# Patient Record
Sex: Male | Born: 1985 | Race: White | Hispanic: No
Health system: Western US, Academic
[De-identification: ages and names within clinical notes are randomized; demographics above are authoritative.]

## PROBLEM LIST (undated history)

## (undated) DIAGNOSIS — Z8669 Personal history of other diseases of the nervous system and sense organs: Secondary | ICD-10-CM

## (undated) DIAGNOSIS — R040 Epistaxis: Secondary | ICD-10-CM

## (undated) HISTORY — DX: Personal history of other diseases of the nervous system and sense organs: Z86.69

## (undated) HISTORY — DX: Epistaxis: R04.0

## (undated) HISTORY — PX: SURGICAL HX OTHER: 99

---

## 2012-05-03 IMAGING — US US SCROTUM
1 series · 14 of 25 positions shown · non-contrast
Comparison: None.

CLINICAL DATA: Left scrotal lesion.

ULTRASOUND OF SCROTUM
TECHNIQUE: Complete ultrasound examination of the testicles,
epididymis, and other scrotal structures was performed.

[Series 1: us scrotum · 0.07mm/px · 14 of 35 slices shown]
[im 1/35]
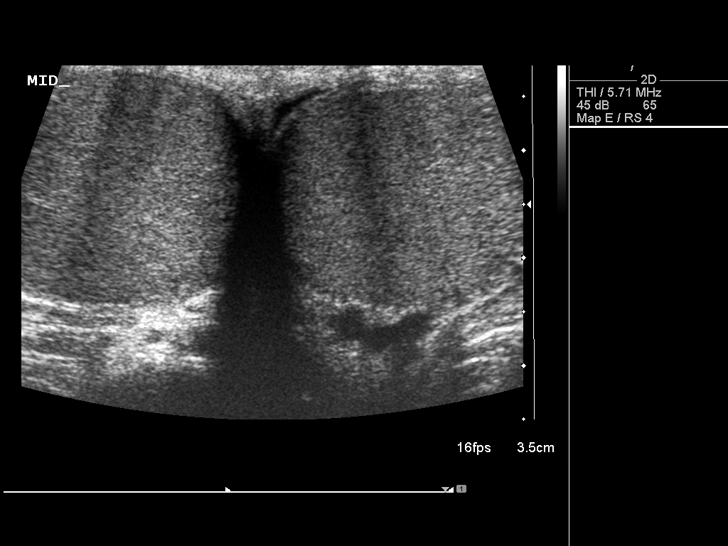
[im 3/35]
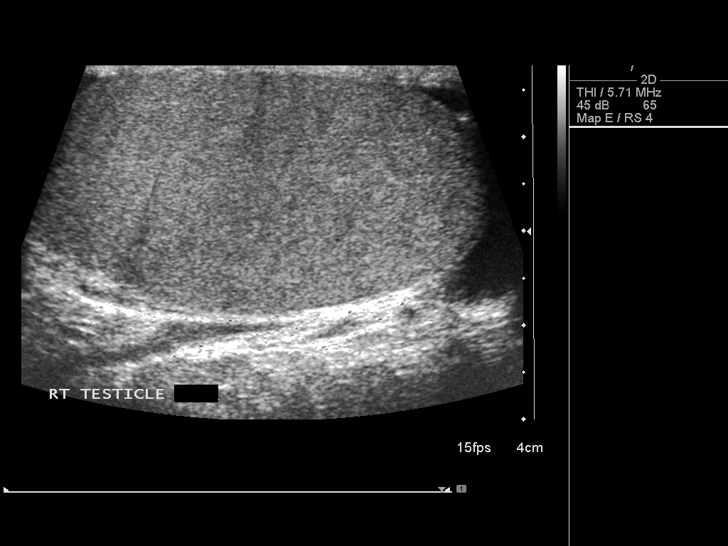
[im 6/35]
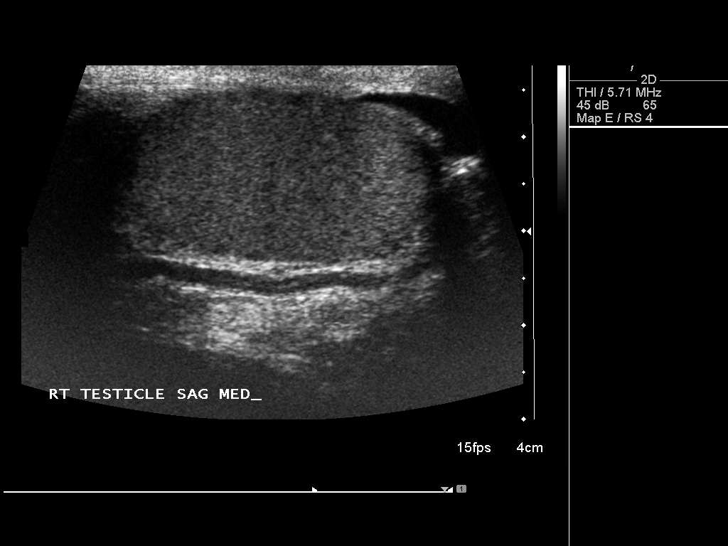
[im 9/35]
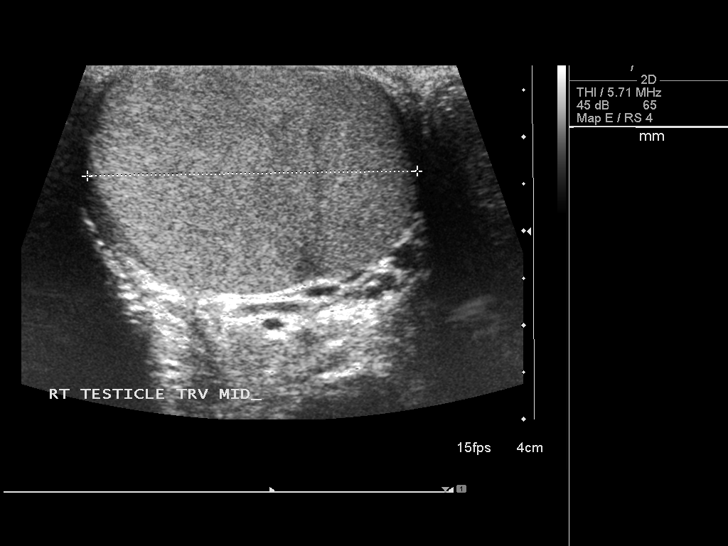
[im 12/35]
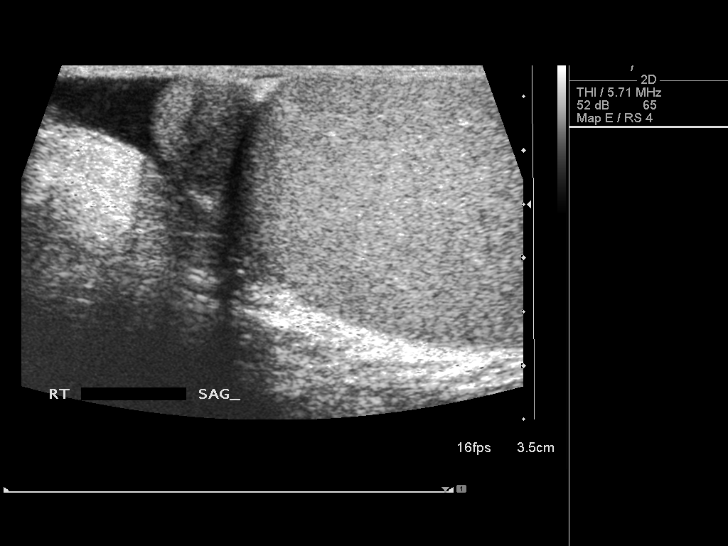
[im 13/35]
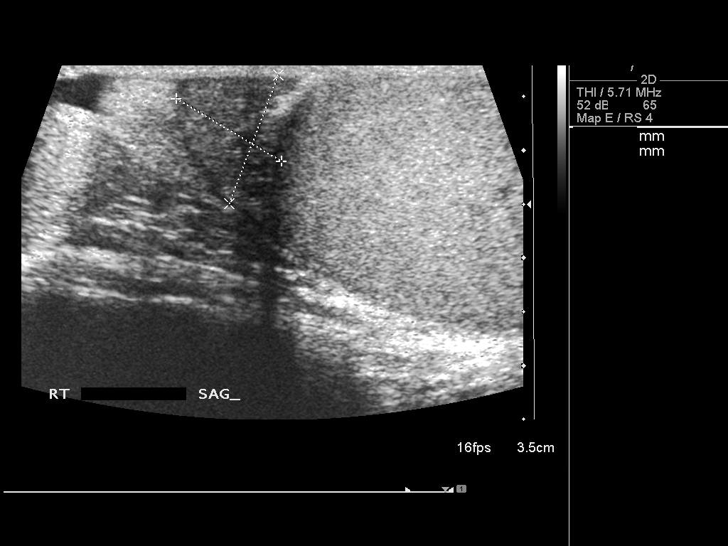
[im 16/35]
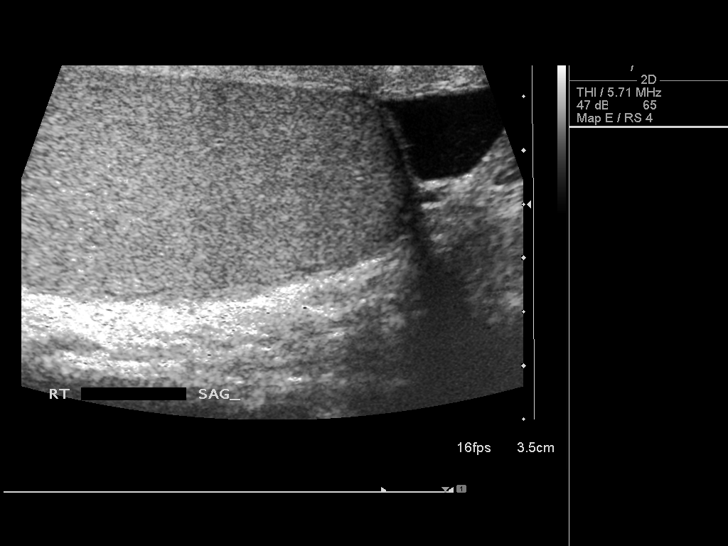
[im 19/35]
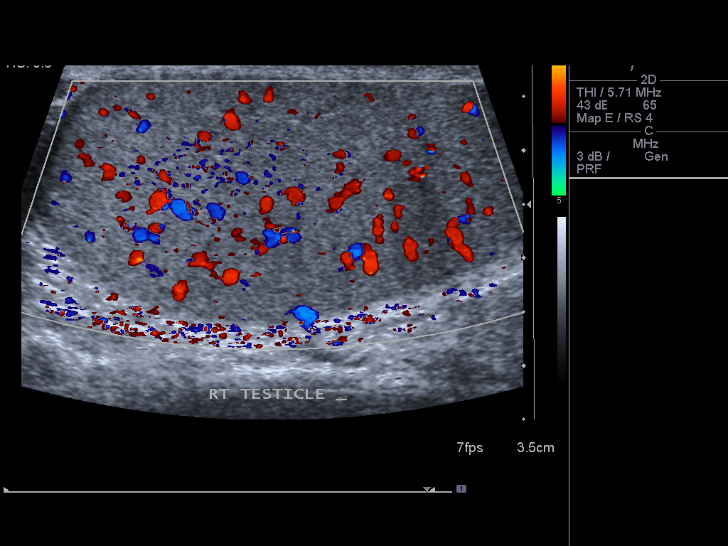
[im 22/35]
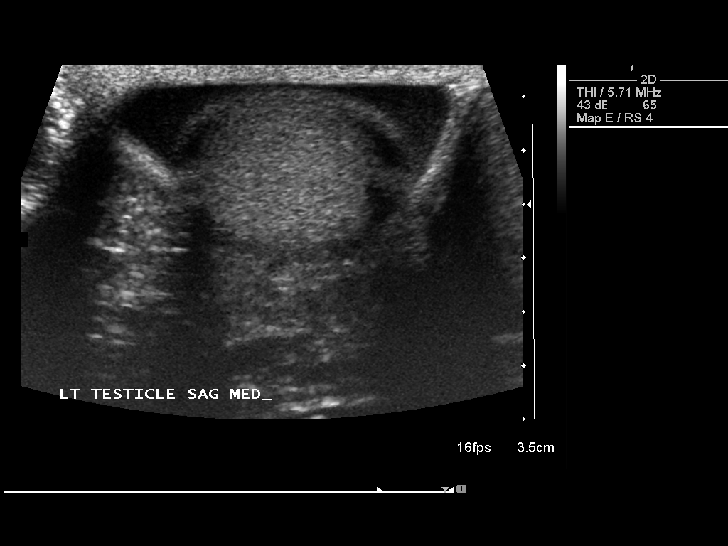
[im 23/35]
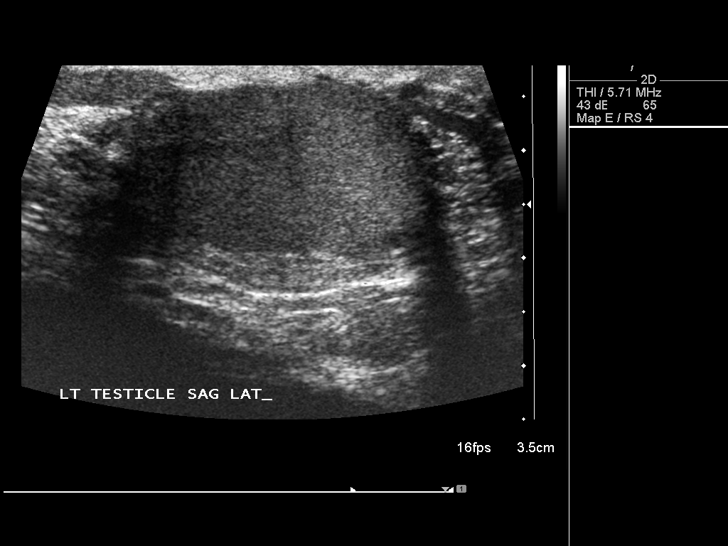
[im 26/35]
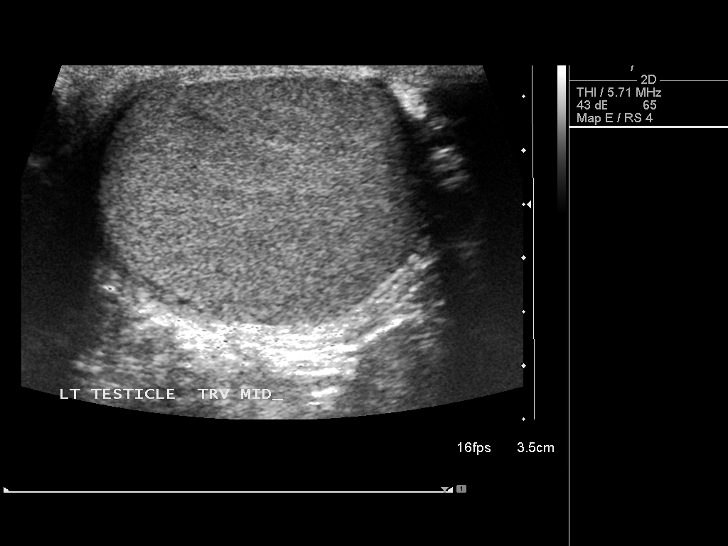
[im 29/35]
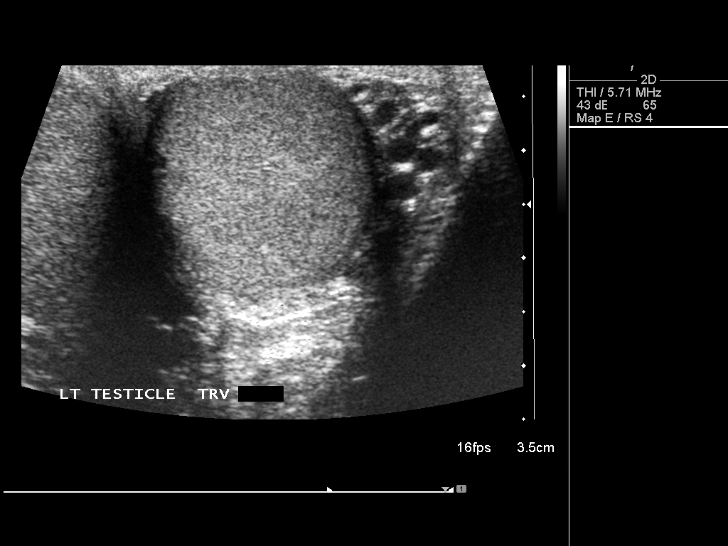
[im 32/35]
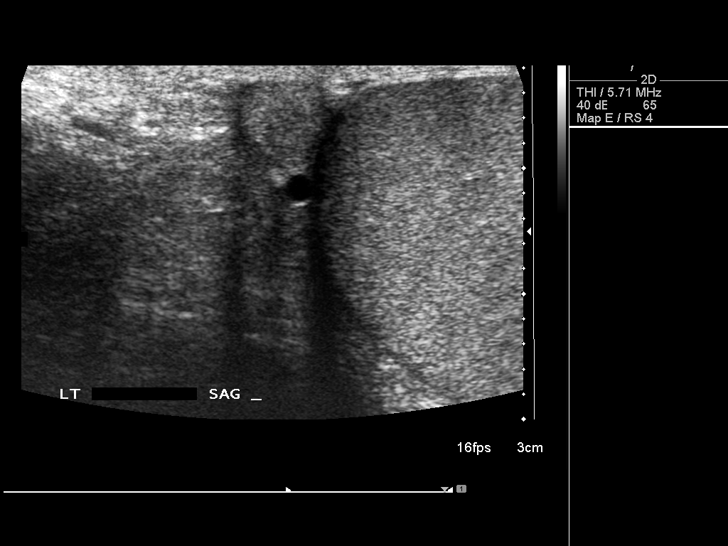
[im 35/35]
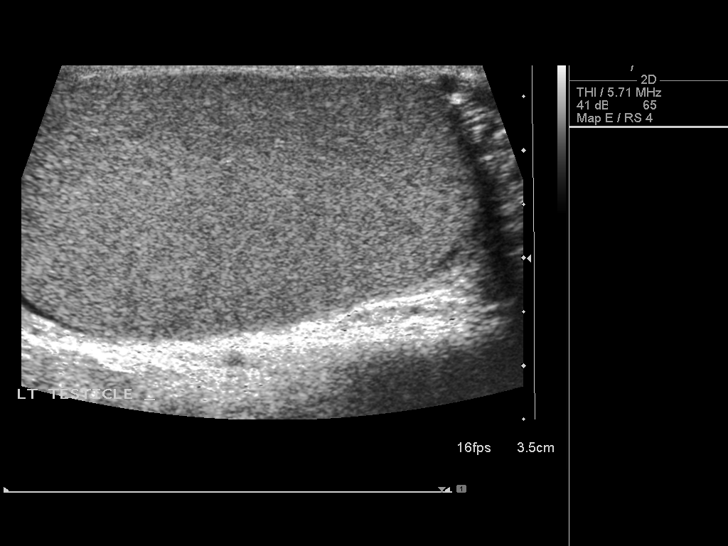

[14 of 25 positions shown; findings below may reference images not displayed]

FINDINGS: Right testis:  Measures 4.8 x 2.6 x 3.5 cm.  Homogeneous
echotexture without focal lesions.  Intra testicular color flow
Doppler is noted.

Left testis:  Measures 4.5 x 2.6 x 3.0 cm.  Homogeneous
echogenicity without focal lesions.  Intra testicular color flow
Doppler is noted.

Right epididymis:  Normal in size and appearance.

Left epididymis:  Normal in size and appearance.A 3 mm epididymal
cyst is noted.

Hydrocele:  None.

Varicocele:  None.
IMPRESSION: 1.  Normal sonographic appearance of both testicles.  No mass
lesions.
2.  3 mm left epididymal cyst.

## 2021-12-09 ENCOUNTER — Other Ambulatory Visit: Payer: Self-pay

## 2022-01-25 ENCOUNTER — Ambulatory Visit (INDEPENDENT_AMBULATORY_CARE_PROVIDER_SITE_OTHER): Payer: BLUE CROSS/BLUE SHIELD | Admitting: Otolaryngology

## 2022-01-25 ENCOUNTER — Encounter (INDEPENDENT_AMBULATORY_CARE_PROVIDER_SITE_OTHER): Payer: Self-pay | Admitting: Otolaryngology

## 2022-01-25 VITALS — BP 120/76 | HR 97 | Ht 72.0 in | Wt 195.0 lb

## 2022-01-25 DIAGNOSIS — G4739 Other sleep apnea: Secondary | ICD-10-CM

## 2022-01-25 DIAGNOSIS — J310 Chronic rhinitis: Secondary | ICD-10-CM

## 2022-01-25 MED ORDER — MUPIROCIN 2 % EX OINT
TOPICAL_OINTMENT | Freq: Two times a day (BID) | CUTANEOUS | 1 refills | Status: AC
Start: 2022-01-25 — End: ?

## 2022-01-25 NOTE — Progress Notes (Signed)
PATIENT:    Noah Gray  DATE OF BIRTH:   08-Sep-1986  DATE:    01/25/2022  HISTORIAN:   Self  VISIT TYPE:   Office Visit  REFERRING PROVIDER: No ref. provider found  PCP:    No primary care provider on file.      This 36 year old male presents for Snoring      History of Present Illness:    This is a 36 year old male with history of septoplasty, presenting with concerns of snoring. He states this has been an issue for years. His partner noticed he was excessively snoring, and occassionally waking up at night gasping for air, or appeared to be choking. He recently had a sleep study in December 2022.  A review of the 3 night home sleep study showed AHI levels below 5 on all 3 nights with no significant desaturations below 90%.  The patient reports waking up tired most days, despite his bedtime being 9 pm - 6 am. He also experiences a headache when waking up on occasion. He mostly breathes through his nose at night. He has woken himself up from snoring at times. The patient states he sleeps with some elevation using a regular pillow. He endorses heartburn that he treats with Pepcid, and restless extremities during sleep.  The patient does endorse nocturia, getting up usually once a night to use the restroom.  He denies history of hypertension, arrhythmias, sleep walking.  He denies any significant weight fluctuations.      The patient has a history of septoplasty approximately 6-7 years ago. He states he is currently breathing fine through his nose. He has notices scabbing in both nostrils for the past year. He is using over the counter ointments to help heal his nose.    Denies tobacco use.  Has 1-2 alcoholic beverages per week.      Review of Systems:    As per HPI      Past Medical/Family/Surgical/Social History, as well as medication allergies and current medications, were reviewed today.      Physical Exam:    Vitals:    01/25/22 1426   Pulse: 97   BP: 120/76   SpO2: 98%   Height: 6' (1.829 m)   Weight:  88.5 kg (195 lb)       Constitutional: Well developed.  No acute distress.  Well nourished. Voice and speech normal.  Awake, alert, responds appropriately.    Head:  Normocephalic. No masses, lesions, tenderness or abnormalities.  No facial asymmetry or weakness noted.    Eye:  Lids/periorbital skin normal, Conjunctivae/corneas clear, EOM's intact.    Ear:  External ears normal. Canals clear. TM's normal and mobile to pneumatic otoscopy.  Hearing grossly normal.    Nose:  External nose normal.  Nasal valve normal .  Nares and mucosa normal. Septum relatively midline , Turbinates normal bilaterally.  Mild crusting/scabbing noted along the base of the septum on both sides.    Oral Cavity: Lips, teeth and gums normal.  Tongue normal, MMM.    Oropharynx: Hard palate normal. Redundant tissue in soft palate, Uvula normal , Tonsils normal bilaterally.  Posterior pharynx normal without erythema or exudate.  MMP 4/4.    Neck:  Supple, normal to palpation. Parotid and submandibular glands normal bilaterally. Thyroid normal.  No cervical adenopathy.       Procedures:    Laryngoscopy Procedure Note    Procedure: Transnasal flexible fiberoptic laryngoscopy    Anesthesia:  Cotton balls soaked with a combination of Afrin and 4% lidocaine placed into the nasal passageways for several minutes and then removed.    Indication:   Sleep apnea    Description:     A flexible fiberoptic endoscope was advanced through the nasal cavity to evaluate the nasopharynx, oropharynx, hypopharynx and laryngeal structures. Once the endoscope was withdrawn, the patient was noted to have tolerated the procedure well without complications and was returned to ambulatory status.     Findings:     Nasopharynx: Tissue is healthy appearing without mass or lesion.  Moderate AP collapse of the soft palate, more pronounced in the supine position and more pronounced with Mueller maneuver.    Oropharynx: Tissue is healthy appearing without mass or lesion.  No  significant lateral wall collapse on Mueller maneuver.    Base of Tongue: Tissue is symmetric and healthy appearing without mass or lesion.  Only mild macroglossia noted.    Valeculla: Tissue is symmetric and healthy appearing without mass or lesion.  No significant posterior collapse of the epiglottis noted when the patient is supine.    Larynx: Tissue is healthy appearing without mass or lesion.    Hypopharynx: Post-cricoid area and posterior-lateral pharyngeal walls are symmetric and healthy appearing without mass or lesion.    Vocal folds:  symmetric, fully mobile, and healthy appearing without mass or lesion.    Subglottis (limited view):  without obstruction, mass or narrowing.        Assessment/Plan:    1. Other sleep apnea  We talked about the risks of untreated sleep apnea, including but not limited to increased risk of getting into an accident, increased chance of developing hypertension, increased risk of stroke, increased risk of developing cardiac disease, and increased risk of developing diabetes.  Even though the home sleep study did not show evidence of apnea, the patient's history and exam findings are suggestive of some sleep-related disorder, so I have recommended that he move forward with an in lab sleep study..  The patient would like to to proceed and we will get the patient scheduled for that as soon as possible.  He is to follow-up with me after the sleep study has been completed.      2. Chronic rhinitis  The patient presents with bilateral scabbing in the nares. He will begin applying mupirocin 2 times a day until healed.      Electronically signed by:  Lennart Pall, PA Student

## 2022-02-03 ENCOUNTER — Ambulatory Visit (INDEPENDENT_AMBULATORY_CARE_PROVIDER_SITE_OTHER): Payer: BLUE CROSS/BLUE SHIELD | Admitting: Unknown Physician Specialty

## 2022-02-03 ENCOUNTER — Encounter (INDEPENDENT_AMBULATORY_CARE_PROVIDER_SITE_OTHER): Payer: Self-pay | Admitting: Unknown Physician Specialty

## 2022-02-03 VITALS — BP 127/76 | HR 86 | Temp 97.5°F | Resp 18 | Ht 72.0 in | Wt 195.0 lb

## 2022-02-03 DIAGNOSIS — D229 Melanocytic nevi, unspecified: Secondary | ICD-10-CM

## 2022-02-03 DIAGNOSIS — Z0000 Encounter for general adult medical examination without abnormal findings: Secondary | ICD-10-CM

## 2022-02-03 DIAGNOSIS — F419 Anxiety disorder, unspecified: Secondary | ICD-10-CM

## 2022-02-03 DIAGNOSIS — K635 Polyp of colon: Secondary | ICD-10-CM

## 2022-02-03 MED ORDER — ESCITALOPRAM OXALATE 10 MG OR TABS
ORAL_TABLET | ORAL | 0 refills | Status: DC
Start: 2022-02-03 — End: 2022-04-18

## 2022-02-03 NOTE — Patient Instructions (Signed)
Referral to dermatology today.  Reach out to [place] near the end of the year for colonoscopy referral.  Start escitalopram today.    Thank you for choosing [facility] and [facility] for your health care.     Here are a few tips for obtaining information or assistance:    Refills:  At least 3 business days before you run out of a medication, call your pharmacy or use the "Request Rx Renewal" function in eCare.     Test Results: We will try to call you with urgent abnormal results. For other results, if you are on eCare, lab results will be available to you in 2-4 days and most X-rays in 7days. If you are not on eCare, please allow 1-2 weeks for a call or letter regarding non-urgent results.       Non-urgent Concerns: Use eCare for non-urgent matters. We will reply within 3 business days.  Otherwise call [phone]    Urgent issues:  Call [phone]. Our triage nurse or after-hours staff will help evaluate your concern, which might include advising you to be seen in clinic or an emergency room.  For an acute life-threatening condition, call 911 or have someone take you to the nearest emergency room.

## 2022-02-03 NOTE — Progress Notes (Signed)
[facility] - [facility]  Internal Medicine    ID/CC  Noah Gray is a 36 year old male presenting for   Chief Complaint   Patient presents with   . Wellness   . Establish Care       HPI:  1.  Establishing care today.  2.  Wondering about seeing a dermatologist for skin check. Has always seen them annually. Multiple nevi, some previously removed, none cancer/pre-cancerous.  3.  Had a colonoscopy ~4 year ago due to early [place] of polyps, records in [place] from 2019. Removed 18m polyp and recommended to repeat in 5 years (12/2022)  4.  Was treating ADHD w/ adderall and vyvanse (did not like how either made him feel), now wonders if he may have anxiety. GAD7 of 11 today.  5.  [facility], planning to get sleep study.    There is no problem list on file for this patient.     Past Surgical History:   Procedure Laterality Date   . Deviated Septum         Review of patient's allergies indicates:  No Known Allergies  Current Outpatient Medications   Medication Sig Dispense Refill   . escitalopram 10 MG tablet Take 0.5 tablets (5 mg) by mouth daily for 8 days, THEN 1 tablet (10 mg) daily. 64 tablet 0   . mupirocin 2 % ointment Apply topically 2 times a day. Apply to inside of the nose using a thin q tip. 22 g 1     No current facility-administered medications for this visit.       Family History     Problem (# of Occurrences) Relation (Name,Age of Onset)    Hypertension (1) Mother    Heart Disease (1) Paternal Grandfather    Diabetes (1) Maternal Grandfather        Social History     Tobacco Use   . Smoking status: Never   . Smokeless tobacco: Never   Substance Use Topics   . Alcohol use: Yes     Alcohol/week: 4.0 standard drinks     Types: 4 Standard drinks or equivalent per week   . Drug use: Never       Physical Exam  BP 127/76   Pulse 86   Temp 36.4 C (Temporal)   Resp 18   Ht 6' (1.829 m) Comment: last height  Wt 88.5 kg (195 lb) Comment: pt reported  SpO2 96%   BMI 26.45 kg/m   BP Readings from Last 6  Encounters:   02/03/22 127/76   01/25/22 120/76     Wt Readings from Last 6 Encounters:   02/03/22 88.5 kg (195 lb)   01/25/22 88.5 kg (195 lb)     GENERAL: alert, well-appearing, resting comfortably in NAD  EYES: sclera anicteric, conjunctiva clear  HENT: atraumatic, mucous membranes moist  CV: NR, RR, clear S1 and S2, no murmurs/gallops/rubs; intact and symmetric radial and pedal pulses; no LE edema  PULM: symmetric chest rise, normal work of breathing on room air, lungs clear to auscultation bilaterally  ABDOMEN: soft, non-distended, non-tender, no palpable organomegaly  EXTREMITIES: normal bulk and tone  NEURO: CN grossly intact; moves all extremities purposefully and spontaneously; gait normal  PSYCH: normal affect      Assessment/Plan  Noah Tetzloffis a 36year old male who presents to establish care.    Routine general medical examination at a health care facility  Overall doing well. BP good. Mildly  elevated BMI. Very active. Bikes a lot. Mostly UTD on HCM. No labs today as he had some in [facility] that are normal. Will plan to update at next visit.    Anxiety  GAD7 of 11 today and self-describes anxiety. Has therapist who has mentioned anxiety to him as well. Previously on sertraline for 3-4 months and gave him some brain fog. We discussed trying another SSRI today. If ineffective, we can try duloxetine.  - escitalopram 10 MG tablet; Take 0.5 tablets (5 mg) by mouth daily for 8 days, THEN 1 tablet (10 mg) daily.  Dispense: 64 tablet; Refill: 0    Multiple nevi  Has been recommended to get annual skin checks by prior dermatologist. Overdue now. Has had nevi removed, none w/ cancer/pre-cancerous. No lesions of concern today but would like to establish. External referral placed.  - Referral to Dermatology; Future    Polyp of colon, unspecified part of colon, unspecified type  Colonoscopy in 2019 w/ 28m polyp removed. Recommended follow-up in 12/2022.      No follow-ups on file. or sooner prn     DLuberta Mutter  MD  Patient was SEEN/DISCUSSED: discussed with attending physician: Dr. AHilda Blades

## 2022-03-03 NOTE — Progress Notes (Signed)
I have personally discussed the case with the resident during or immediately after the patient visit including review of history, physical exam, diagnosis, and treatment plan. I agree with the assessment and plan of care.

## 2022-04-18 ENCOUNTER — Other Ambulatory Visit (INDEPENDENT_AMBULATORY_CARE_PROVIDER_SITE_OTHER): Payer: Self-pay | Admitting: Unknown Physician Specialty

## 2022-04-18 ENCOUNTER — Ambulatory Visit (INDEPENDENT_AMBULATORY_CARE_PROVIDER_SITE_OTHER): Payer: Self-pay

## 2022-04-18 DIAGNOSIS — F419 Anxiety disorder, unspecified: Secondary | ICD-10-CM

## 2022-04-18 NOTE — Telephone Encounter (Signed)
Call Type:  Triage Call    [place] Name:  [place] Name:  [facility] [place] [address]    Presenting Problem:  rx isuues    Assessment:  01. Symptoms: What are your symptoms? denies triage at this time; out of town- left medication at home; needing Escitalopram (Lexapro) called into pharmacy, pt has 7-10 pills left    Triage Note:  [id number]. Triage offered and declined.    Guideline Title:  Medication Question Call (Adult)    Guideline Question:  [1] Caller has NON-URGENT medicine question about med that PCP prescribed AND [2] triager unable to answer question? Yes    Recommended Disposition:  Call PCP or Video Visit When Office is Open    Original Inclination:  Addressed problem at home (research, call family)    Intended Action:  Call PCP When Office is Open    Care Advice:  Care Advice given per Medication Question Call (Adult) guideline.  Call PCP When Office Is Open:  * You need to discuss this with your doctor (or NP/PA) within the next few days.  * Call the office when it is open.  Call Back If:  * You have more questions or concerns

## 2022-04-19 MED ORDER — ESCITALOPRAM OXALATE 10 MG OR TABS
ORAL_TABLET | ORAL | 0 refills | Status: DC
Start: 2022-04-19 — End: 2022-06-06

## 2022-04-19 NOTE — Telephone Encounter (Signed)
Patient last seen on 02/03/22 and was to   Return in about 6 weeks (around 03/17/2022) for SSRI follow-up; telemed ok.    One refill authorized. Please schedule follow up visit.

## 2022-04-20 ENCOUNTER — Encounter (HOSPITAL_BASED_OUTPATIENT_CLINIC_OR_DEPARTMENT_OTHER): Payer: Self-pay

## 2022-04-20 ENCOUNTER — Encounter (INDEPENDENT_AMBULATORY_CARE_PROVIDER_SITE_OTHER): Payer: Self-pay | Admitting: Unknown Physician Specialty

## 2022-04-20 NOTE — Telephone Encounter (Signed)
MC msg sent with scheduling ticket    CCR: If patient calls back please schedule appointment per message below. Thank you

## 2022-04-20 NOTE — Telephone Encounter (Signed)
Scheduled [place] appointment with PCP 7/12.

## 2022-04-27 ENCOUNTER — Ambulatory Visit: Attending: Otolaryngology | Admitting: Family

## 2022-04-27 ENCOUNTER — Encounter (HOSPITAL_BASED_OUTPATIENT_CLINIC_OR_DEPARTMENT_OTHER): Payer: Self-pay

## 2022-04-27 DIAGNOSIS — G4730 Sleep apnea, unspecified: Secondary | ICD-10-CM | POA: Insufficient documentation

## 2022-04-27 DIAGNOSIS — R0683 Snoring: Secondary | ICD-10-CM | POA: Insufficient documentation

## 2022-04-27 DIAGNOSIS — R400 Somnolence: Secondary | ICD-10-CM | POA: Insufficient documentation

## 2022-04-27 DIAGNOSIS — R5383 Other fatigue: Secondary | ICD-10-CM | POA: Insufficient documentation

## 2022-04-27 DIAGNOSIS — G4739 Other sleep apnea: Secondary | ICD-10-CM | POA: Insufficient documentation

## 2022-04-27 NOTE — Progress Notes (Signed)
SLEEP MEDICINE INITIAL TELEMEDICINE CONSULTATION    Distant Site Telemedicine Encounter    I conducted this encounter from Off-site location (home, non-[place] location) via secure, live, face-to-face video conference with the patient. [name] was located at At home.  I reviewed the risks and benefits of telemedicine as pertinent to this visit and the patient agreed to proceed.     PREVIOUS SLEEP TESTING  Yes, home sleep apnea testing 10/2021 at outside facility negative for sleep disordered breathing per patient report.     REFERRING PROVIDER: Windell Gray  PRIMARY CARE PROVIDER: Myrna Blazer, MD    We are asked to see this patient referred for consultation from Noah Gray for evaluation of sleep concerns.    HISTORY OF PRESENT ILLNESS    Noah Gray is a 36 year old male with a history of snoring who presents today for evaluation of possible sleep apnea. The patient was referred to our office by his otolaryngologist, Dr. Marcene Duos, MD, with [facility].     He has snored for as long as he can remember. Bed partners have witnessed apnea events. He feels tired and does not wake up refreshed. History of septoplasty which helped with snoring and breathing.     The patient completed three nights of home sleep apnea testing at the end of last year. He does not recall name of clinic, was in the [place] area. "I think I scored at 40 decibels." Weight has not changed significantly since the sleep study.     Typical Sleep Habits include:    Bedtime: 9-10 pm; no difficulty falling asleep  Wakeup: 6-7:30 am, uses alarm  Does NOT feel refreshed upon waking up.   Sleep schedule is different on weekends. He may go to bed around midnight and wake up at 7 am.     Naps: Yes, 4-5 days/week for 15-20 mins    CAFFEINE AND SUBSTANCE USE  Caffeine: 1 cup of coffee/day   Alcoholic beverages: 1-2 times/week, 2-5 drinks  Tobacco use: None  Recreational  substances: None    The patient has symptoms suggestive of sleep apnea, including:   snoring,    breathing pauses when you sleep,   waking up with sore throat    Epworth Sleepiness Scale Total: 4    Insomnia Severity Index score 11 out of 28, suggesting insomnia of mild severity.  0-7 no clinically significant insomnia  8-14 subthreshold insomnia  15-21 moderate insomnia  22-28 severe insomnia    LAB  No results found for: CO2    REVIEW OF SYSTEMS  Complete ROS was otherwise negative in relation to chief complaint, except as documented in HPI above.      ALLERGIES    Patient has no known allergies.    MEDICATIONS      Current Outpatient Medications:     escitalopram 10 MG tablet, Take 0.5 tablets (5 mg) by mouth daily for 8 days, THEN 1 tablet (10 mg) daily., Disp: 64 tablet, Rfl: 0    mupirocin 2 % ointment, Apply topically 2 times a day. Apply to inside of the nose using a thin q tip., Disp: 22 g, Rfl: 1    PAST MEDICAL HISTORY  Past Medical History:   Diagnosis Date    History of sleep apnea     NEGATIVE PAST MEDICAL HISTORY OF     Nosebleed      Past Surgical History:   Procedure Laterality Date  Deviated Septum         SOCIAL HISTORY  Social History     Socioeconomic History    Marital status: Single   Tobacco Use    Smoking status: Never    Smokeless tobacco: Never   Substance and Sexual Activity    Alcohol use: Yes     Alcohol/week: 4.0 standard drinks     Types: 4 Standard drinks or equivalent per week    Drug use: Never    Sexual activity: Yes     Partners: Female   Social History Narrative    Originally from [place] and moved to [place] 1.5 years ago. Lives with girlfriend.        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     FAMILY HISTORY  Family History       Problem (# of Occurrences) Relation (Name,Age of Onset)    Hypertension (1) Mother    Heart Disease (1) Paternal Grandfather    Diabetes  (1) Maternal Grandfather          Snoring: mother, father    PHYSICAL EXAM  There were no vitals taken for this visit.  Pt. reported Current Weight: 200 lbs; 5-10 lbs weight fluctuation within past year  Pt. reported Height: 6'0    GEN: NAD, able to give a full history with normal speech.  RESP: Breathing comfortably on room air.  NEURO: A&Ox3.  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  The patient has a history of snoring and witnessed apneas at night.  Reports daytime sleepiness. On exam, has a crowded airway. Family history of snoring.    I explained to the patient the implications of untreated sleep apnea as a cause of hypertension and increased risk of cardiovascular consequences, such as stroke, heart attack, heart failure, cardiac arrhythmias, or insulin resistance.  Patients may also present with other problems such as nocturia, tiredness, or memory problems.  I also explained that chronic conditions such as anxiety, depression or pain may be harder to control and the presence of untreated sleep apnea.    We discussed diagnostic options for sleep apnea, as in lab polysomnography vs. home testing (HSAT: home sleep apnea testing). Patient completed home sleep testing six months ago that were negative for sleep apnea. There is concern the studies were false negatives and an in-lab PSG is indicated as a next step.    We also discussed treatment options for obstructive sleep apnea, including positive airway pressure, CPAP (gold standard), weight loss, positional therapy, mandibular advancement device, and surgery.     If the study is positive only for obstructive sleep apnea, I am going to set up the patient on an AutoPAP to expedite his  treatment.     PLAN  Order PSG  Initiate PAP therapy if positive for sleep apnea  Return to clinic 6  weeks after starting PAP therapy      I spent a total of 30 minutes for the patient's care on the date of the service.    Thank you for the opportunity to participate in this patient's care.

## 2022-04-27 NOTE — Patient Instructions (Signed)
Hello Ceferino,     It was nice to meet you today. As we discussed, I have ordered an in-lab sleep study for you to complete. Please contact our office at [phone], option #2 to schedule the study.     After you complete the sleep study, you will receive your results through MyChart in 1-3 weeks.     If your sleep study shows that you have sleep apnea, we will have you try a CPAP machine as we discussed during your appointment today. A prescription for a CPAP machine would be created and sent to a third party called a durable medical equipment (DME) company. The DME company is similar to a pharmacy, but instead of selling medications, they sell medical supplies. The DME company would contact you 1-2 weeks after the prescription is sent. It is important that you schedule a follow up with our office for 6 weeks after you start using your CPAP. You can schedule that appointment by contacting the number above and selecting option #1.     Ahead of your next visit:   -Please complete follow up forms ahead of each visit. These forms can be accessed in your MyChart. Completing these forms saves [place] both time during your appointment.   -If you need assistance, our preferred method of contact is the patient portal.   -We offer both telemedicine and in-person appointments. For our patients comfortable with telemedicine, we encourage this option.     Please let us know if you have any questions.     Thank you,   Talbert Forest, DNP, ARNP, FNP-C  Nurse Practitioner  [facility]

## 2022-05-16 ENCOUNTER — Telehealth (INDEPENDENT_AMBULATORY_CARE_PROVIDER_SITE_OTHER): Payer: Self-pay | Admitting: Unknown Physician Specialty

## 2022-05-16 ENCOUNTER — Ambulatory Visit: Attending: Pulmonary Disease

## 2022-05-16 DIAGNOSIS — G4733 Obstructive sleep apnea (adult) (pediatric): Secondary | ICD-10-CM | POA: Insufficient documentation

## 2022-05-16 DIAGNOSIS — R0683 Snoring: Secondary | ICD-10-CM | POA: Insufficient documentation

## 2022-05-16 NOTE — Telephone Encounter (Signed)
Lvm to schedule-1st attempt    CCR: If patient calls back please transfer to [place] Front Desk. Thank you

## 2022-05-16 NOTE — Telephone Encounter (Signed)
Pt sched 8/1

## 2022-05-16 NOTE — Telephone Encounter (Signed)
RETURN CALL: Voicemail - Detailed Message      SUBJECT:  Appointment Request     REASON FOR VISIT: Follow up  PREFERRED DATE/TIME: ASAP  REASON UNABLE TO APPOINT: Patient wishes to see his PCP Myrna Blazer for an appointment, either Telehealth or in office, but there was no appointments available

## 2022-05-17 ENCOUNTER — Telehealth (INDEPENDENT_AMBULATORY_CARE_PROVIDER_SITE_OTHER): Admitting: Unknown Physician Specialty

## 2022-05-24 NOTE — Progress Notes (Signed)
Established with Violet Robb, ARNP    The sleep test showed:  severe OSA  OSA was Same in REM sleep  Mild Desaturation   89% nadir SpO2      The next steps are:   PAP Rx & Setup  Follow-Up in Clinic: after PAP setup (~4-6 weeks)    See Sleep Study for more information

## 2022-05-29 ENCOUNTER — Encounter (HOSPITAL_BASED_OUTPATIENT_CLINIC_OR_DEPARTMENT_OTHER): Payer: Self-pay

## 2022-05-29 NOTE — Progress Notes (Signed)
/  DURABLE MEDICAL EQUIPMENT ORDER    Sleep Prescription sent to:     DME supplier: [facility]: Phone [phone], Fax [phone].    Provider: Skip Mayer, ARNP     Rx:: Auto-CPAP (continuous positive airway pressure) therapy, also known as APAP     Additional notes:  New Set-up    Prescription date: 05/24/22    Helane Gunther, Coordinator, 05/29/2022 9:34 AM  [place] at [facility]

## 2022-06-02 ENCOUNTER — Encounter (HOSPITAL_BASED_OUTPATIENT_CLINIC_OR_DEPARTMENT_OTHER): Payer: Self-pay

## 2022-06-02 NOTE — Telephone Encounter (Signed)
CCR: Please schedule follow-up with Sleep Provider or their FELLOW. This appointment should be no sooner than 31 days and no later than 90 days from starting PAP (positive airway pressure) therapy.     DME: [facility]: Phone [phone], Fax [phone].     Device:  Auto-CPAP (continuous positive airway pressure) therapy, also known as APAP    Pressure:  5-15 cm H2O    Plan:  Follow up with Skip Mayer, ARNP at the [place] [facility] no sooner than 31 days and no later than 90 days from starting therapy.    This patient, Noah Gray, has been contacted via MyChart (formerly Programmer, systems) message and given their sleep study results and the recommendations from their Sleep Medicine provider.     The patient has been given the [place] at [facility] phone number of [phone] and encouraged to call with questions or concerns.    Oris Drone, RN, 06/02/2022 11:27 AM  [facility] at [facility]  _________________________________    Notes    Danelle Earthly, MD at 05/16/2022  7:30 PM    Status: Signed   Established with Skip Mayer, ARNP     The sleep test showed:  severe OSA  OSA was Same in REM sleep  Mild Desaturation   89% nadir SpO2        The next steps are:   PAP Rx & Setup  Follow-Up in Clinic: after PAP setup (~4-6 weeks)    See Sleep Study for more information

## 2022-06-06 ENCOUNTER — Telehealth (INDEPENDENT_AMBULATORY_CARE_PROVIDER_SITE_OTHER): Admitting: Unknown Physician Specialty

## 2022-06-06 DIAGNOSIS — K649 Unspecified hemorrhoids: Secondary | ICD-10-CM

## 2022-06-06 DIAGNOSIS — G4733 Obstructive sleep apnea (adult) (pediatric): Secondary | ICD-10-CM

## 2022-06-06 DIAGNOSIS — F419 Anxiety disorder, unspecified: Secondary | ICD-10-CM

## 2022-06-06 MED ORDER — ESCITALOPRAM OXALATE 10 MG OR TABS
5.0000 mg | ORAL_TABLET | Freq: Every day | ORAL | 3 refills | Status: DC
Start: 2022-06-06 — End: 2023-06-19

## 2022-06-06 NOTE — Progress Notes (Signed)
[facility] [place]  Internal Medicine    Distant Site Telemedicine Encounter  I conducted this encounter via secure, live, face-to-face video conference with the patient. I reviewed the risks and benefits of telemedicine as pertinent to this visit and the patient agreed to proceed.    Provider Location: On-site location (clinic, hospital, on-site office)  Patient Location: In his car  Present with patient: No one else present        ID/CC  Noah Gray is a 36 year old male presenting for the following concerns:    Subjective:  Started escitalopram for anxiety-related symptoms. Was planning to titrate up to 10mg  but felt like he had benefit at 5mg  and has remained on that.    Only side effect he has noticed is diarrhea which occurs around the time he is taking the dose, but very rarely (not in several weeks).    Also has some hemorrhoids he has been managing with OTC suppositories.    Exam:  There were no vitals taken for this visit.  BP Readings from Last 6 Encounters:   02/03/22 127/76   01/25/22 120/76     Wt Readings from Last 6 Encounters:   02/03/22 88.5 kg (195 lb)   01/25/22 88.5 kg (195 lb)     General: Well appearing in no acute distress  Respiratory: breathing comfortably   Psych: normal mood and affect      Assessment/Plan  Noah Gray is a 36 year old male who presents for SSRI follow-up.    Anxiety  Feeling much better controlled with 5mg  dose. Discussed possibility of titrating off if he is interested in the future and he will consider. No SI. Will follow-up in 6-12 months.  - escitalopram 10 MG tablet; Take 0.5 tablets (5 mg) by mouth daily.  Dispense: 45 tablet; Refill: 3    Hemorrhoids, unspecified hemorrhoid type  Managing with suppositories. Discussed hydration, fiber, avoidance of straining/constipation. Can consider miralax/metamucil as needed. Follow-up prn.    OSA (obstructive sleep apnea)  Following with OSH sleep medicine and started on CPAP.       Return in about  1 year (around 06/07/2023). or sooner as needed     Luberta Mutter, MD  Patient was SEEN/DISCUSSED: discussed with attending physician: Dr. Annett Gula

## 2022-06-13 NOTE — Progress Notes (Signed)
I have personally discussed the case with the resident during or immediately after the patient visit including review of history, physical exam, diagnosis, and treatment plan. I agree with the assessment and plan of care.

## 2022-08-16 ENCOUNTER — Other Ambulatory Visit: Payer: Self-pay

## 2023-03-26 ENCOUNTER — Encounter (INDEPENDENT_AMBULATORY_CARE_PROVIDER_SITE_OTHER): Payer: Self-pay

## 2023-06-05 ENCOUNTER — Encounter (HOSPITAL_COMMUNITY): Payer: Self-pay

## 2023-06-19 ENCOUNTER — Encounter (HOSPITAL_BASED_OUTPATIENT_CLINIC_OR_DEPARTMENT_OTHER): Payer: Self-pay | Admitting: Family

## 2023-06-19 ENCOUNTER — Other Ambulatory Visit (INDEPENDENT_AMBULATORY_CARE_PROVIDER_SITE_OTHER): Payer: Self-pay | Admitting: Unknown Physician Specialty

## 2023-06-19 ENCOUNTER — Other Ambulatory Visit (HOSPITAL_BASED_OUTPATIENT_CLINIC_OR_DEPARTMENT_OTHER): Payer: Self-pay | Admitting: Family

## 2023-06-19 DIAGNOSIS — F419 Anxiety disorder, unspecified: Secondary | ICD-10-CM

## 2023-06-19 DIAGNOSIS — G4733 Obstructive sleep apnea (adult) (pediatric): Secondary | ICD-10-CM

## 2023-06-21 MED ORDER — ESCITALOPRAM OXALATE 10 MG OR TABS
5.0000 mg | ORAL_TABLET | Freq: Every day | ORAL | 0 refills | Status: AC
Start: 2023-06-21 — End: 2024-06-20

## 2023-06-21 NOTE — Telephone Encounter (Signed)
1st attempt- Sent [facility] message with an attached scheduling ticket.    CCR: If patient calls back please schedule appointment per message below. Thank you

## 2023-06-21 NOTE — Telephone Encounter (Signed)
Patient is due for yearly exam.  One refill authorized.  Please schedule follow up visit.

## 2023-07-03 ENCOUNTER — Encounter (HOSPITAL_BASED_OUTPATIENT_CLINIC_OR_DEPARTMENT_OTHER): Payer: Self-pay | Admitting: Family

## 2023-07-03 NOTE — Progress Notes (Signed)
DURABLE MEDICAL EQUIPMENT ORDER    DME supplier: [facility]: Phone [phone], Fax [phone].    Provider: Drema Dallas, ARNP     Rx:: Supplies for PAP (positive airway pressure)     Additional notes:  Supplies only    Prescription faxed on: 06/19/2023    Richrd Humbles, Coordinator  [facility] at [facility]

## 2023-08-21 ENCOUNTER — Other Ambulatory Visit: Payer: Self-pay

## 2024-09-05 ENCOUNTER — Encounter (INDEPENDENT_AMBULATORY_CARE_PROVIDER_SITE_OTHER): Payer: Self-pay
